# Patient Record
Sex: Female | Born: 1995 | Race: White | Hispanic: No | Marital: Single | Smoking: Never smoker
Health system: Southern US, Community
[De-identification: ages and names within clinical notes are randomized; demographics above are authoritative.]

## PROBLEM LIST (undated history)

## (undated) DIAGNOSIS — R768 Other specified abnormal immunological findings in serum: Secondary | ICD-10-CM

## (undated) DIAGNOSIS — R519 Headache, unspecified: Secondary | ICD-10-CM

## (undated) DIAGNOSIS — R5382 Chronic fatigue, unspecified: Secondary | ICD-10-CM

## (undated) DIAGNOSIS — R918 Other nonspecific abnormal finding of lung field: Secondary | ICD-10-CM

## (undated) DIAGNOSIS — F32A Depression, unspecified: Secondary | ICD-10-CM

## (undated) DIAGNOSIS — F329 Major depressive disorder, single episode, unspecified: Secondary | ICD-10-CM

## (undated) DIAGNOSIS — M2550 Pain in unspecified joint: Secondary | ICD-10-CM

## (undated) DIAGNOSIS — E538 Deficiency of other specified B group vitamins: Secondary | ICD-10-CM

## (undated) DIAGNOSIS — R51 Headache: Secondary | ICD-10-CM

## (undated) HISTORY — DX: Pain in unspecified joint: M25.50

## (undated) HISTORY — DX: Major depressive disorder, single episode, unspecified: F32.9

## (undated) HISTORY — DX: Other nonspecific abnormal finding of lung field: R91.8

## (undated) HISTORY — PX: OTHER SURGICAL HISTORY: SHX169

## (undated) HISTORY — DX: Deficiency of other specified B group vitamins: E53.8

## (undated) HISTORY — DX: Headache: R51

## (undated) HISTORY — DX: Other specified abnormal immunological findings in serum: R76.8

## (undated) HISTORY — DX: Headache, unspecified: R51.9

## (undated) HISTORY — DX: Chronic fatigue, unspecified: R53.82

## (undated) HISTORY — DX: Depression, unspecified: F32.A

---
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
## 2014-10-18 ENCOUNTER — Encounter: Payer: Self-pay | Admitting: Internal Medicine

## 2014-10-18 ENCOUNTER — Encounter (INDEPENDENT_AMBULATORY_CARE_PROVIDER_SITE_OTHER): Payer: Self-pay

## 2014-10-18 ENCOUNTER — Ambulatory Visit (INDEPENDENT_AMBULATORY_CARE_PROVIDER_SITE_OTHER): Payer: BC Managed Care – PPO | Admitting: Internal Medicine

## 2014-10-18 VITALS — BP 108/60 | HR 82 | Temp 98.1°F | Ht 66.75 in | Wt 120.0 lb

## 2014-10-18 DIAGNOSIS — F329 Major depressive disorder, single episode, unspecified: Secondary | ICD-10-CM

## 2014-10-18 DIAGNOSIS — F32A Depression, unspecified: Secondary | ICD-10-CM

## 2014-10-18 DIAGNOSIS — F419 Anxiety disorder, unspecified: Secondary | ICD-10-CM

## 2014-10-18 MED ORDER — ESCITALOPRAM OXALATE 10 MG PO TABS: 10.0000 mg | ORAL_TABLET | Freq: Every day | ORAL | Status: DC

## 2014-10-18 NOTE — Progress Notes (Signed)
Pre visit review using our clinic review tool, if applicable. No additional management support is needed unless otherwise documented below in the visit note. 

## 2014-10-18 NOTE — Patient Instructions (Signed)

## 2014-10-18 NOTE — Assessment & Plan Note (Signed)
Support offered today She does not feel like she wants to go up on her Lexapro She declines referral for counseling for her depression and self harm behaviors No SI/HI- instructed to stop her medication and go to the nearest ER if these thoughts ever occur

## 2014-10-18 NOTE — Progress Notes (Signed)
HPI  Pt presents to the clinic today to establish care. She is transferring care from her PCP in [place] although she is originally from [place].  Depression: Well controlled on Lexapro. She reports her parents forced her off of it while she was in high school. She did restart it it for the last 2 months ago. She reports it had been working well until 2 weeks ago when her friend told her that she was going to commit suicide. She was able to help her work through that but she has felt shaken up ever since. She does have a history of self harm. She has been cutting for the last 2 days. She feels like this is being triggered by her plans to travel home for the holidays.   Frequent Headaches: Associated with chronic vertigo since age 89. Her recent gluten/lactose free diet has helped.   Her LMP was 10/12/14.  Past Medical History  Diagnosis Date  . Depression   . Frequent headaches     Current Outpatient Prescriptions  Medication Sig Dispense Refill  . escitalopram (LEXAPRO) 10 MG tablet Take by mouth.    . hyoscyamine (ANASPAZ) 0.125 MG TBDP disintergrating tablet Place 0.125 mg under the tongue.    . Multiple Vitamin (MULTIVITAMIN) tablet Take 1 tablet by mouth daily.     No current facility-administered medications for this visit.    Allergies  Allergen Reactions  . Gluten Meal Other (See Comments)    "collapse"  . Lactose Intolerance (Gi) Other (See Comments)    Abdominal pain    Family History  Problem Relation Age of Onset  . Cancer Maternal Grandmother     lung and breast  . Diabetes Paternal Grandfather     History   Social History  . Marital Status: Single    Spouse Name: N/A    Number of Children: N/A  . Years of Education: N/A   Occupational History  . Not on file.   Social History Main Topics  . Smoking status: Never Smoker   . Smokeless tobacco: Never Used  . Alcohol Use: 0.0 oz/week    0 Not specified per week     Comment: occasional  . Drug Use: Not on  file  . Sexual Activity: Not on file   Other Topics Concern  . Not on file   Social History Narrative  . No narrative on file    ROS:  Constitutional: Denies fever, malaise, fatigue, headache or abrupt weight changes.  Respiratory: Denies difficulty breathing, shortness of breath, cough or sputum production.   Cardiovascular: Denies chest pain, chest tightness, palpitations or swelling in the hands or feet.  Gastrointestinal: Denies abdominal pain, bloating, constipation, diarrhea or blood in the stool.  GU: Denies frequency, urgency, pain with urination, blood in urine, odor or discharge. Psych: Pt reports depression. Denies anxiety, SI/HI.  No other specific complaints in a complete review of systems (except as listed in HPI above).  PE:  BP 108/60 mmHg  Pulse 82  Temp(Src) 98.1 F (36.7 C) (Oral)  Ht 5' 6.75" (1.695 m)  Wt 120 lb (54.432 kg)  BMI 18.95 kg/m2  SpO2 99%  LMP 10/12/2014 Wt Readings from Last 3 Encounters:  10/18/14 120 lb (54.432 kg) (34 %*, Z = -0.41)   * Growth percentiles are based on CDC 2-20 Years data.    General: Appears her stated age, well developed, well nourished in NAD. Cardiovascular: Normal rate and rhythm. S1,S2 noted.  No murmur, rubs or gallops noted.  Pulmonary/Chest: Normal effort and positive vesicular breath sounds. No respiratory distress. No wheezes, rales or ronchi noted.  Neurological: Alert and oriented.  Psychiatric: Mood and affect normal. Behavior is normal. Judgment and thought content normal.    Assessment and Plan:

## 2014-12-23 ENCOUNTER — Telehealth: Payer: Self-pay

## 2014-12-23 ENCOUNTER — Emergency Department: Payer: Self-pay | Admitting: Emergency Medicine

## 2014-12-23 LAB — COMPREHENSIVE METABOLIC PANEL
ALK PHOS: 89 U/L (ref 46–116)
ANION GAP: 6 — AB (ref 7–16)
Albumin: 3.7 g/dL — ABNORMAL LOW (ref 3.8–5.6)
BILIRUBIN TOTAL: 0.7 mg/dL (ref 0.2–1.0)
BUN: 8 mg/dL (ref 7–18)
CALCIUM: 9.7 mg/dL (ref 9.0–10.7)
CO2: 32 mmol/L (ref 21–32)
CREATININE: 0.83 mg/dL (ref 0.60–1.30)
Chloride: 104 mmol/L (ref 98–107)
EGFR (African American): >60
Glucose: 82 mg/dL (ref 65–99)
OSMOLALITY: 281 (ref 275–301)
POTASSIUM: 4.1 mmol/L (ref 3.5–5.1)
SGOT(AST): 50 U/L — ABNORMAL HIGH (ref 0–26)
SGPT (ALT): 51 U/L (ref 14–63)
SODIUM: 142 mmol/L (ref 136–145)
Total Protein: 8 g/dL (ref 6.4–8.6)

## 2014-12-23 LAB — CBC WITH DIFFERENTIAL/PLATELET
BASOS ABS: 0.1 10*3/uL (ref 0.0–0.1)
Basophil %: 0.6 %
EOS PCT: 1.3 %
Eosinophil #: 0.1 10*3/uL (ref 0.0–0.7)
HCT: 39.7 % (ref 35.0–47.0)
HGB: 13.5 g/dL (ref 12.0–16.0)
Lymphocyte #: 2.5 10*3/uL (ref 1.0–3.6)
Lymphocyte %: 28.3 %
MCH: 29.7 pg (ref 26.0–34.0)
MCHC: 33.9 g/dL (ref 32.0–36.0)
MCV: 88 fL (ref 80–100)
Monocyte #: 0.7 x10 3/mm (ref 0.2–0.9)
Monocyte %: 7.4 %
NEUTROS ABS: 5.5 10*3/uL (ref 1.4–6.5)
NEUTROS PCT: 62.4 %
Platelet: 293 10*3/uL (ref 150–440)
RBC: 4.54 10*6/uL (ref 3.80–5.20)
RDW: 13.2 % (ref 11.5–14.5)
WBC: 8.9 10*3/uL (ref 3.6–11.0)

## 2014-12-23 LAB — URINALYSIS, COMPLETE
Bilirubin,UR: NEGATIVE
Blood: NEGATIVE
Glucose,UR: NEGATIVE mg/dL (ref 0–75)
KETONE: NEGATIVE
LEUKOCYTE ESTERASE: NEGATIVE
Nitrite: NEGATIVE
PH: 9 (ref 4.5–8.0)
PROTEIN: NEGATIVE
SPECIFIC GRAVITY: 1.006 (ref 1.003–1.030)

## 2014-12-23 NOTE — Telephone Encounter (Signed)
Spoke with Nicki Reaperegina Baity NP and if blood more than tablespoon pt should be evaluated at ED; pt voiced understanding and will go to ED for eval.

## 2014-12-23 NOTE — Telephone Encounter (Signed)
pT was in [place] for 30 days; 2 weeks ago pt had pain in rt upper abd pain under rib cage. Pt saw a doctor in [place] and there was a virus with cramps; pt took med for 2 days and pain went away; then last day in [place] (12/12/14) pt ran high fever; Pt did not take temp due to no thermometer; pt took advil and within 24 - 30 hrs fever was gone. No fever or abd pain since in [place]. 5 days ago pt noticed harder stool (pt not constipated but BM is very hard). Pt said every time has BM in lat 5 days pt has a lot of bright red blood with BM (Pt said looks like she is having a menstrual period but she is not). Last BM was 12/22/14. Pt is feeling tired but thought might be related to jet lag. Pt returned home 12/15/14. Please advise.

## 2015-01-03 ENCOUNTER — Ambulatory Visit: Payer: Self-pay | Admitting: Family Medicine

## 2015-01-04 ENCOUNTER — Ambulatory Visit: Payer: Self-pay | Admitting: Family Medicine

## 2015-01-04 ENCOUNTER — Ambulatory Visit (INDEPENDENT_AMBULATORY_CARE_PROVIDER_SITE_OTHER): Payer: BLUE CROSS/BLUE SHIELD | Admitting: Family Medicine

## 2015-01-04 ENCOUNTER — Encounter: Payer: Self-pay | Admitting: Family Medicine

## 2015-01-04 VITALS — BP 98/60 | HR 74 | Temp 98.3°F | Ht 66.75 in | Wt 124.8 lb

## 2015-01-04 DIAGNOSIS — S30814A Abrasion of vagina and vulva, initial encounter: Secondary | ICD-10-CM

## 2015-01-04 DIAGNOSIS — B3731 Acute candidiasis of vulva and vagina: Secondary | ICD-10-CM | POA: Insufficient documentation

## 2015-01-04 DIAGNOSIS — B373 Candidiasis of vulva and vagina: Secondary | ICD-10-CM

## 2015-01-04 MED ORDER — FLUCONAZOLE 150 MG PO TABS: 150.0000 mg | ORAL_TABLET | Freq: Once | ORAL | Status: DC

## 2015-01-04 NOTE — Assessment & Plan Note (Signed)
Pt not sexually active.  Cover with barrier cream to avoid irritation and promote healing.

## 2015-01-04 NOTE — Assessment & Plan Note (Signed)
Treat with diflucan. 

## 2015-01-04 NOTE — Patient Instructions (Signed)
Apply barrier cream such as Desitin to abrasion for a few days. Treat likely yeast infection with diflucan.  Call if not improving as expected.

## 2015-01-04 NOTE — Progress Notes (Signed)
   Subjective:    Patient ID: Kristi Gray, female    DOB: 02/14/1996, 19 y.o.   MRN: [id number]  HPI   19 year old female presents with new onset  vaginal itching, burning redness, some area of bump.. Some sore spots in vaginal area. On menses, mild discharge prior to menses.  History of internal hemorrhoid.  Recent antibiotics started for face in last month.  She is not sexually active, never had sex.     Review of Systems  Constitutional: Negative for fever and fatigue.  HENT: Negative for ear pain.   Eyes: Negative for pain.  Respiratory: Negative for chest tightness and shortness of breath.   Cardiovascular: Negative for chest pain, palpitations and leg swelling.  Gastrointestinal: Negative for abdominal pain.  Genitourinary: Negative for dysuria.       Objective:   Physical Exam  Constitutional: Vital signs are normal. She appears well-developed and well-nourished. She is cooperative.  Non-toxic appearance. She does not appear ill. No distress.  HENT:  Head: Normocephalic.  Right Ear: Hearing, tympanic membrane, external ear and ear canal normal. Tympanic membrane is not erythematous, not retracted and not bulging.  Left Ear: Hearing, tympanic membrane, external ear and ear canal normal. Tympanic membrane is not erythematous, not retracted and not bulging.  Nose: No mucosal edema or rhinorrhea. Right sinus exhibits no maxillary sinus tenderness and no frontal sinus tenderness. Left sinus exhibits no maxillary sinus tenderness and no frontal sinus tenderness.  Mouth/Throat: Uvula is midline, oropharynx is clear and moist and mucous membranes are normal.  Eyes: Conjunctivae, EOM and lids are normal. Pupils are equal, round, and reactive to light. Lids are everted and swept, no foreign bodies found.  Neck: Trachea normal and normal range of motion. Neck supple. Carotid bruit is not present. No thyroid mass and no thyromegaly present.  Cardiovascular: Normal rate, regular  rhythm, S1 normal, S2 normal, normal heart sounds, intact distal pulses and normal pulses.  Exam reveals no gallop and no friction rub.   No murmur heard. Pulmonary/Chest: Effort normal and breath sounds normal. No tachypnea. No respiratory distress. She has no decreased breath sounds. She has no wheezes. She has no rhonchi. She has no rales.  Abdominal: Soft. Normal appearance and bowel sounds are normal. There is no tenderness.  Genitourinary: There is tenderness in the vagina. No erythema in the vagina. No foreign body around the vagina. There are signs of injury in the vagina. No vaginal discharge found.  Has menses but put tampon in place, small abrasion in perineum below vaginal opening, no other lesions   Neurological: She is alert.  Skin: Skin is warm, dry and intact. No rash noted.  Psychiatric: Her speech is normal and behavior is normal. Judgment and thought content normal. Her mood appears not anxious. Cognition and memory are normal. She does not exhibit a depressed mood.          Assessment & Plan:

## 2015-01-04 NOTE — Progress Notes (Signed)
Pre visit review using our clinic review tool, if applicable. No additional management support is needed unless otherwise documented below in the visit note. 

## 2015-02-27 ENCOUNTER — Other Ambulatory Visit: Payer: Self-pay

## 2015-02-27 ENCOUNTER — Telehealth: Payer: Self-pay | Admitting: Internal Medicine

## 2015-02-27 MED ORDER — CLONAZEPAM 0.5 MG PO TABS: 0.5000 mg | ORAL_TABLET | Freq: Every day | ORAL | Status: DC | PRN

## 2015-02-27 MED ORDER — ESCITALOPRAM OXALATE 5 MG PO TABS: 5.0000 mg | ORAL_TABLET | Freq: Every day | ORAL | Status: DC

## 2015-02-27 NOTE — Telephone Encounter (Signed)
Rx called in to pharmacy. 

## 2015-02-27 NOTE — Telephone Encounter (Signed)
Received a fax from her therapist, Montel ClockKatherine Wagner. She has been working with [employer] on anxiety and panic attacks. She continues to have worsening anxiety and panic attacks. She recommends increasing Lexapro to 15 mg and adding Klonipin prn. Lexapro increased. Please phone in Klonipin.

## 2015-02-28 ENCOUNTER — Ambulatory Visit (INDEPENDENT_AMBULATORY_CARE_PROVIDER_SITE_OTHER): Payer: BLUE CROSS/BLUE SHIELD | Admitting: Internal Medicine

## 2015-02-28 ENCOUNTER — Encounter: Payer: Self-pay | Admitting: Internal Medicine

## 2015-02-28 VITALS — BP 102/66 | HR 73 | Temp 98.5°F | Wt 121.5 lb

## 2015-02-28 DIAGNOSIS — F32A Depression, unspecified: Secondary | ICD-10-CM

## 2015-02-28 DIAGNOSIS — L709 Acne, unspecified: Secondary | ICD-10-CM | POA: Diagnosis not present

## 2015-02-28 DIAGNOSIS — F419 Anxiety disorder, unspecified: Principal | ICD-10-CM

## 2015-02-28 DIAGNOSIS — F329 Major depressive disorder, single episode, unspecified: Secondary | ICD-10-CM

## 2015-02-28 DIAGNOSIS — F418 Other specified anxiety disorders: Secondary | ICD-10-CM

## 2015-02-28 MED ORDER — MINOCYCLINE HCL 100 MG PO CAPS: 100.0000 mg | ORAL_CAPSULE | Freq: Every day | ORAL | Status: DC

## 2015-02-28 NOTE — Patient Instructions (Signed)
Generalized Anxiety Disorder Generalized anxiety disorder (GAD) is a mental disorder. It interferes with life functions, including relationships, work, and school. GAD is different from normal anxiety, which everyone experiences at some point in their lives in response to specific life events and activities. Normal anxiety actually helps us prepare for and get through these life events and activities. Normal anxiety goes away after the event or activity is over.  GAD causes anxiety that is not necessarily related to specific events or activities. It also causes excess anxiety in proportion to specific events or activities. The anxiety associated with GAD is also difficult to control. GAD can vary from mild to severe. People with severe GAD can have intense waves of anxiety with physical symptoms (panic attacks).  SYMPTOMS The anxiety and worry associated with GAD are difficult to control. This anxiety and worry are related to many life events and activities and also occur more days than not for 6 months or longer. People with GAD also have three or more of the following symptoms (one or more in children):  Restlessness.   Fatigue.  Difficulty concentrating.   Irritability.  Muscle tension.  Difficulty sleeping or unsatisfying sleep. DIAGNOSIS GAD is diagnosed through an assessment by your health care provider. Your health care provider will ask you questions aboutyour mood,physical symptoms, and events in your life. Your health care provider may ask you about your medical history and use of alcohol or drugs, including prescription medicines. Your health care provider may also do a physical exam and blood tests. Certain medical conditions and the use of certain substances can cause symptoms similar to those associated with GAD. Your health care provider may refer you to a mental health specialist for further evaluation. TREATMENT The following therapies are usually used to treat GAD:    Medication. Antidepressant medication usually is prescribed for long-term daily control. Antianxiety medicines may be added in severe cases, especially when panic attacks occur.   Talk therapy (psychotherapy). Certain types of talk therapy can be helpful in treating GAD by providing support, education, and guidance. A form of talk therapy called cognitive behavioral therapy can teach you healthy ways to think about and react to daily life events and activities.  Stress managementtechniques. These include yoga, meditation, and exercise and can be very helpful when they are practiced regularly. A mental health specialist can help determine which treatment is best for you. Some people see improvement with one therapy. However, other people require a combination of therapies. Document Released: 03/01/2013 Document Revised: 03/21/2014 Document Reviewed: 03/01/2013 ExitCare Patient Information 2015 [employer]. This information is not intended to replace advice given to you by your health care provider. Make sure you discuss any questions you have with your health care provider.  

## 2015-02-28 NOTE — Progress Notes (Signed)
Pre visit review using our clinic review tool, if applicable. No additional management support is needed unless otherwise documented below in the visit note. 

## 2015-02-28 NOTE — Progress Notes (Signed)
Subjective:    Patient ID: Kristi Gray, female    DOB: 08/20/1996, 19 y.o.   MRN: [id number]  HPI  Pt presents to the clinic today to follow up anxiety and depression. I received a fax from her therapist earlier this week. Her anxiety had gotten significantly worse. Her therapist reports that she had about 8 panic attacks in 1 week. Her therapist advised her to take 15 mg of Lexapro daily. She only had 10 mg capsules, so she has been taking 10 mg one day followed by 20 mg the next day. I also called her in some Clonazepam to help control her anxiety until she could get in to see me. She reports her symptoms have not improved. She still feels very anxious and she is having pressure in her chest. She is not sure what is triggering her constant anxiety. She denies SI/HI. She reports she will be getting an emotional support animal in the fall and thinks that will be helpful.  Of note, she would like her Minocycline refilled. It works well for her acne.  Review of Systems      Past Medical History  Diagnosis Date  . Depression   . Frequent headaches     Current Outpatient Prescriptions  Medication Sig Dispense Refill  . clonazePAM (KLONOPIN) 0.5 MG tablet Take 1 tablet (0.5 mg total) by mouth daily as needed for anxiety. 30 tablet 0  . escitalopram (LEXAPRO) 10 MG tablet Take 1 tablet (10 mg total) by mouth daily. 90 tablet 3  . escitalopram (LEXAPRO) 5 MG tablet Take 1 tablet (5 mg total) by mouth daily. 30 tablet 2  . hyoscyamine (ANASPAZ) 0.125 MG TBDP disintergrating tablet Place 0.125 mg under the tongue.    . Miconazole Nitrate (MONISTAT 3 VA) Place vaginally.    . minocycline (MINOCIN,DYNACIN) 100 MG capsule Take 100 mg by mouth daily.    . Multiple Vitamin (MULTIVITAMIN) tablet Take 1 tablet by mouth daily.     No current facility-administered medications for this visit.    Allergies  Allergen Reactions  . Gluten Meal Other (See Comments)    "collapse"  . Lactose  Intolerance (Gi) Other (See Comments)    Abdominal pain    Family History  Problem Relation Age of Onset  . Cancer Maternal Grandmother     lung and breast  . Diabetes Paternal Grandfather   . Stroke Neg Hx     History   Social History  . Marital Status: Single    Spouse Name: N/A  . Number of Children: N/A  . Years of Education: N/A   Occupational History  . Not on file.   Social History Main Topics  . Smoking status: Never Smoker   . Smokeless tobacco: Never Used  . Alcohol Use: 0.0 oz/week    0 Standard drinks or equivalent per week     Comment: occasional  . Drug Use: No  . Sexual Activity: No   Other Topics Concern  . Not on file   Social History Narrative     Constitutional: Denies fever, malaise, fatigue, headache or abrupt weight changes.  Respiratory: Pt reports shortness of breath. Denies difficulty breathing, cough or sputum production.   Cardiovascular: Pt reports chest tightness. Denies chest pain, palpitations or swelling in the hands or feet.  Neurological: Denies dizziness, difficulty with memory, difficulty with speech or problems with balance and coordination.  Psych: Pt reports anxiety and panic attacks. Denies depression, SI/HI.  No other specific complaints in  a complete review of systems (except as listed in HPI above).  Objective:   Physical Exam  BP 102/66 mmHg  Pulse 73  Temp(Src) 98.5 F (36.9 C) (Oral)  Wt 121 lb 8 oz (55.112 kg)  SpO2 99%  LMP 02/08/2015 Wt Readings from Last 3 Encounters:  02/28/15 121 lb 8 oz (55.112 kg)  01/04/15 124 lb 12 oz (56.586 kg) (43 %*, Z = -0.17)  10/18/14 120 lb (54.432 kg) (34 %*, Z = -0.41)   * Growth percentiles are based on CDC 2-20 Years data.    General: Appears herstated age, well developed, well nourished in NAD. Skin: Warm, dry and intact.  Cardiovascular: Normal rate and rhythm. S1,S2 noted.  No murmur, rubs or gallops noted.  Pulmonary/Chest: Normal effort and positive vesicular  breath sounds. No respiratory distress. No wheezes, rales or ronchi noted.  Neurological: Alert and oriented.  Psychiatric: Mood slightly anxious but affect normal. Behavior is normal. Judgment and thought content normal.       Assessment & Plan:

## 2015-02-28 NOTE — Assessment & Plan Note (Signed)
Continue working with your therapist Will increase Lexapro to 15 mg daily Clonazepam prn for panic attack  Update me in 4 weeks on how you are doing

## 2015-02-28 NOTE — Assessment & Plan Note (Signed)
Minocycline refilled today 

## 2015-03-28 ENCOUNTER — Other Ambulatory Visit: Payer: Self-pay | Admitting: Internal Medicine

## 2015-06-06 ENCOUNTER — Other Ambulatory Visit: Payer: Self-pay

## 2015-06-06 DIAGNOSIS — F329 Major depressive disorder, single episode, unspecified: Secondary | ICD-10-CM

## 2015-06-06 DIAGNOSIS — F32A Depression, unspecified: Secondary | ICD-10-CM

## 2015-06-06 MED ORDER — ESCITALOPRAM OXALATE 10 MG PO TABS: 10.0000 mg | ORAL_TABLET | Freq: Every day | ORAL | Status: DC

## 2015-06-06 MED ORDER — ESCITALOPRAM OXALATE 5 MG PO TABS: 5.0000 mg | ORAL_TABLET | Freq: Every day | ORAL | Status: DC

## 2015-06-06 NOTE — Telephone Encounter (Signed)
Pt left v/m; pt staying in [place] for the summer; pt just spoke with therapist and therapist is to request Lexapro refill (pt takes 5 mg and 10 mg)from [facility] Incegina Baity NP(pt said she takes 15 mg of Lexapro; should the lexapro 5 mg instructions be changed to 1 tab daily instead of 3 tabs daily since pt is requesting the 10 mg and 5 mg tabs?). Pt request refill to go to [facility] [address], [place]Please advise.

## 2015-07-13 ENCOUNTER — Other Ambulatory Visit: Payer: Self-pay | Admitting: Internal Medicine

## 2015-08-07 ENCOUNTER — Other Ambulatory Visit: Payer: Self-pay | Admitting: Internal Medicine

## 2015-08-08 MED ORDER — ESCITALOPRAM OXALATE 5 MG PO TABS: 5.0000 mg | ORAL_TABLET | Freq: Every day | ORAL | Status: DC

## 2016-01-08 ENCOUNTER — Telehealth: Payer: Self-pay

## 2016-01-08 NOTE — Telephone Encounter (Signed)
Pt left v/m; pt was recently discharged from residential treatment center in [place] then went to [facility], fax # for [facility] fax # [phone]; now needs refill lexapro 20 mg. Med list has lexapro 15 mg. Pt is Consulting civil engineer at [employer]. Pt request cb. Unable to reach pt by phone. Pt last seen at [facility] 02/28/15. No future appt scheduled.

## 2016-01-08 NOTE — Telephone Encounter (Signed)
I will need discharge notes from [place] before I can refill

## 2016-01-09 ENCOUNTER — Telehealth: Payer: Self-pay | Admitting: Internal Medicine

## 2016-01-09 NOTE — Telephone Encounter (Signed)
Patient stated that her last provider said they sent the information to [facility] on Nov 29, 2015.  She said the records should show her most recent dosage information so she can have her prescription renewed.

## 2016-01-09 NOTE — Telephone Encounter (Signed)
Left detailed msg on VM per HIPAA  

## 2016-01-09 NOTE — Telephone Encounter (Signed)
Pt states she will call to see if they can fax information to [place] otherwise she will stop by the office here to sign a medical records release so can fax

## 2016-01-10 ENCOUNTER — Telehealth: Payer: Self-pay | Admitting: Internal Medicine

## 2016-01-10 MED ORDER — ESCITALOPRAM OXALATE 20 MG PO TABS: 20.0000 mg | ORAL_TABLET | Freq: Every day | ORAL | Status: DC

## 2016-01-10 NOTE — Telephone Encounter (Signed)
[facility] from [facility]. Increased Lexapro to 20 mg daily. New RX sent to pharmacy. Please call and let pt know RX was sent.

## 2016-01-10 NOTE — Telephone Encounter (Signed)
Spoke to pt and let her know we had previous records but not from [place]---explained to pt for me to request records, she will have to come in to sign a medical records release---pt stated that she will call the office to see if they will forward records to [place]

## 2016-01-10 NOTE — Telephone Encounter (Signed)
Left detailed msg on VM per HIPAA  

## 2016-01-24 NOTE — Telephone Encounter (Signed)
ERROR

## 2016-08-02 ENCOUNTER — Ambulatory Visit (INDEPENDENT_AMBULATORY_CARE_PROVIDER_SITE_OTHER): Payer: BLUE CROSS/BLUE SHIELD | Admitting: Family Medicine

## 2016-08-02 ENCOUNTER — Encounter: Payer: Self-pay | Admitting: Family Medicine

## 2016-08-02 ENCOUNTER — Telehealth: Payer: Self-pay | Admitting: Internal Medicine

## 2016-08-02 VITALS — BP 80/62 | HR 79 | Temp 98.1°F | Ht 66.75 in | Wt 118.8 lb

## 2016-08-02 DIAGNOSIS — F329 Major depressive disorder, single episode, unspecified: Secondary | ICD-10-CM

## 2016-08-02 DIAGNOSIS — F419 Anxiety disorder, unspecified: Secondary | ICD-10-CM

## 2016-08-02 DIAGNOSIS — Z8719 Personal history of other diseases of the digestive system: Secondary | ICD-10-CM

## 2016-08-02 DIAGNOSIS — F418 Other specified anxiety disorders: Secondary | ICD-10-CM | POA: Diagnosis not present

## 2016-08-02 DIAGNOSIS — R1084 Generalized abdominal pain: Secondary | ICD-10-CM | POA: Diagnosis not present

## 2016-08-02 DIAGNOSIS — F32A Depression, unspecified: Secondary | ICD-10-CM

## 2016-08-02 LAB — CBC WITH DIFFERENTIAL/PLATELET
BASOS ABS: 62 {cells}/uL (ref 0–200)
Basophils Relative: 1 %
EOS ABS: 124 {cells}/uL (ref 15–500)
EOS PCT: 2 %
HCT: 41.7 % (ref 35.0–45.0)
Hemoglobin: 13.9 g/dL (ref 11.7–15.5)
LYMPHS PCT: 37 %
Lymphs Abs: 2294 cells/uL (ref 850–3900)
MCH: 29.4 pg (ref 27.0–33.0)
MCHC: 33.3 g/dL (ref 32.0–36.0)
MCV: 88.3 fL (ref 80.0–100.0)
MONOS PCT: 7 %
MPV: 9.5 fL (ref 7.5–12.5)
Monocytes Absolute: 434 cells/uL (ref 200–950)
NEUTROS ABS: 3286 {cells}/uL (ref 1500–7800)
NEUTROS PCT: 53 %
PLATELETS: 279 10*3/uL (ref 140–400)
RBC: 4.72 MIL/uL (ref 3.80–5.10)
RDW: 12.8 % (ref 11.0–15.0)
WBC: 6.2 10*3/uL (ref 3.8–10.8)

## 2016-08-02 LAB — POCT URINALYSIS DIPSTICK
BILIRUBIN UA: NEGATIVE
Glucose, UA: NEGATIVE
KETONES UA: NEGATIVE
LEUKOCYTES UA: NEGATIVE
Nitrite, UA: NEGATIVE
PH UA: 5
PROTEIN UA: NEGATIVE
Spec Grav, UA: 1.02
Urobilinogen, UA: 0.2

## 2016-08-02 LAB — COMPREHENSIVE METABOLIC PANEL
ALT: 14 U/L (ref 6–29)
AST: 27 U/L (ref 10–30)
Albumin: 4.4 g/dL (ref 3.6–5.1)
Alkaline Phosphatase: 47 U/L (ref 33–115)
BUN: 9 mg/dL (ref 7–25)
CHLORIDE: 104 mmol/L (ref 98–110)
CO2: 25 mmol/L (ref 20–31)
CREATININE: 0.81 mg/dL (ref 0.50–1.10)
Calcium: 9.6 mg/dL (ref 8.6–10.2)
Glucose, Bld: 76 mg/dL (ref 65–99)
POTASSIUM: 4.1 mmol/L (ref 3.5–5.3)
SODIUM: 137 mmol/L (ref 135–146)
Total Bilirubin: 1.1 mg/dL (ref 0.2–1.2)
Total Protein: 7.3 g/dL (ref 6.1–8.1)

## 2016-08-02 LAB — LIPASE: LIPASE: 16 U/L (ref 7–60)

## 2016-08-02 NOTE — Patient Instructions (Signed)
BEFORE YOU LEAVE: -follow up: 1) schedule follow up with PCP in about 5-7 days -labs  No dairy for next 1- 2 weeks.  levsin per instructions  nexium over the counter dose once daily  Seek care sooner if worsening, new symptoms or other concerns.

## 2016-08-02 NOTE — Addendum Note (Signed)
Addended by: Baldwin CrownJOHNSON, SHAQUETTA D on: 08/02/2016 04:08 PM   Modules accepted: Orders

## 2016-08-02 NOTE — Progress Notes (Signed)
HPI:  Kristi Gray is a pleasant 20 yo with a hx of IBS, migraines, gluten intolerance, depression and anxiety here for an acute visit for abd pain. Started 5 days ago. Symptoms include intermittent, non-focal difuse crampy abd pain, odor to urine, and migraine a few days ago accompanied by vertigo (she reports she has had this all in the past). Denies: fevers, malaise, weakness, diarrhea, constipation, nausea, vomiting, melena, hematochezia, dysuria, hematuria, vaginal discharge. Reports has never been sexually active and declines pelvic or STI testing. FDLMP 9/10 - just finished her period. Took levsin once. Symptoms worse before bowel movement and better with BM. Hx appendectomy. No new stressors or worsening anxiety/depression.   ROS: See pertinent positives and negatives per HPI.  Past Medical History:  Diagnosis Date  . Depression   . Frequent headaches     Past Surgical History:  Procedure Laterality Date  . APPENDECTOMY  2008  . wisdom teeth      Family History  Problem Relation Age of Onset  . Cancer Maternal Grandmother     lung and breast  . Diabetes Paternal Grandfather   . Stroke Neg Hx     Social History   Social History  . Marital status: Single    Spouse name: N/A  . Number of children: N/A  . Years of education: N/A   Social History Main Topics  . Smoking status: Never Smoker  . Smokeless tobacco: Never Used  . Alcohol use 0.0 oz/week     Comment: occasional  . Drug use: No  . Sexual activity: No   Other Topics Concern  . None   Social History Narrative  . None     Current Outpatient Prescriptions:  .  escitalopram (LEXAPRO) 20 MG tablet, Take 1 tablet (20 mg total) by mouth daily., Disp: 30 tablet, Rfl: 5 .  Hyoscyamine Sulfate (LEVSIN PO), Take by mouth., Disp: , Rfl:  .  levonorgestrel-ethinyl estradiol (LUTERA) 0.1-20 MG-MCG tablet, Take 1 tablet by mouth daily., Disp: , Rfl:   EXAM:  Vitals:   08/02/16 1505  BP: (!) 80/62   Pulse: 79  Temp: 98.1 F (36.7 C)    Body mass index is 18.75 kg/m.  GENERAL: vitals reviewed and listed above, alert, oriented, appears well hydrated and in no acute distress  HEENT: atraumatic, conjunttiva clear, no obvious abnormalities on inspection of external nose and ears  NECK: no obvious masses on inspection  LUNGS: clear to auscultation bilaterally, no wheezes, rales or rhonchi, good air movement  CV: HRRR, no peripheral edema  ABD: soft, BS+ all 4 quadrant, difuse TTP but seems worse in epigastric region, no rebound or guarding  GU: declined  MS: moves all extremities without noticeable abnormality  PSYCH: pleasant and cooperative, no obvious depression or anxiety  ASSESSMENT AND PLAN:  Discussed the following assessment and plan:  Generalized abdominal pain - Plan: CBC (no diff), Lipase, CMP, H. pylori antigen, stool  History of IBS  Anxiety and depression  -we discussed possible serious and likely etiologies, workup and treatment, treatment risks and return precautions -after this discussion, Kristi Gray opted for labs today, urine studies, levsin, avoidance of dairy, ppi and close follow up -of course, we advised Kristi Gray  to return or notify a doctor immediately if symptoms worsen or persist or new concerns arise.   Patient Instructions  BEFORE YOU LEAVE: -follow up: 1) schedule follow up with PCP in about 5-7 days -labs  No dairy for next 1- 2 weeks.  levsin per instructions  nexium over the counter dose once daily  Seek care sooner if worsening, new symptoms or other concerns.    Kriste Basque R., DO

## 2016-08-02 NOTE — Telephone Encounter (Signed)
Patient Name: Kristi Gray  DOB: 03/30/1996    Initial Comment Caller says, severe abd pains, constant, trouble walking. Previous Dx was IBS.    Nurse Assessment  Nurse: Deatra JamesNoe, RN, Corrie DandyMary Date/Time Lamount Cohen(Eastern Time): 08/02/2016 12:45:13 PM  Confirm and document reason for call. If symptomatic, describe symptoms. You must click the next button to save text entered. ---Patient states she is having severe abd pains, constant, trouble walking. Previous Dx was IBS.  Has the patient traveled out of the country within the last 30 days? ---No  Does the patient have any new or worsening symptoms? ---Yes  Will a triage be completed? ---Yes  Related visit to physician within the last 2 weeks? ---No  Does the PT have any chronic conditions? (i.e. diabetes, asthma, etc.) ---Yes  List chronic conditions. ---"severe gluten intolerance, migraines, vertigo"  Is the patient pregnant or possibly pregnant? (Ask all females between the ages of 3412-55) ---No  Is this a behavioral health or substance abuse call? ---No     Guidelines    Guideline Title Affirmed Question Affirmed Notes       Final Disposition User        Comments  [facility] to [facility] made appointment with Dr. Selena BattenKim in [place] office   Referrals  REFERRED TO PCP OFFICE

## 2016-08-02 NOTE — Addendum Note (Signed)
Addended by: Baldwin CrownJOHNSON, SHAQUETTA D on: 08/02/2016 04:12 PM   Modules accepted: Orders

## 2016-08-02 NOTE — Progress Notes (Signed)
Pre visit review using our clinic review tool, if applicable. No additional management support is needed unless otherwise documented below in the visit note. 

## 2016-08-02 NOTE — Telephone Encounter (Signed)
pt

## 2016-08-02 NOTE — Addendum Note (Signed)
Addended by: Johnella MoloneyFUNDERBURK, JO A on: 08/02/2016 04:01 PM   Modules accepted: Orders

## 2016-08-02 NOTE — Telephone Encounter (Signed)
Pt has appt with Dr Kriste BasqueKim Hannah 09/*15/17 at 3:30.

## 2016-08-03 LAB — URINALYSIS, ROUTINE W REFLEX MICROSCOPIC
Bilirubin Urine: NEGATIVE
GLUCOSE, UA: NEGATIVE
Hgb urine dipstick: NEGATIVE
Ketones, ur: NEGATIVE
LEUKOCYTES UA: NEGATIVE
Nitrite: NEGATIVE
PH: 7 (ref 5.0–8.0)
PROTEIN: NEGATIVE
SPECIFIC GRAVITY, URINE: 1.012 (ref 1.001–1.035)

## 2016-08-05 ENCOUNTER — Encounter: Payer: Self-pay | Admitting: Internal Medicine

## 2016-08-05 ENCOUNTER — Ambulatory Visit (INDEPENDENT_AMBULATORY_CARE_PROVIDER_SITE_OTHER): Payer: BLUE CROSS/BLUE SHIELD | Admitting: Internal Medicine

## 2016-08-05 VITALS — BP 94/58 | HR 80 | Temp 98.8°F | Wt 119.2 lb

## 2016-08-05 DIAGNOSIS — R1084 Generalized abdominal pain: Secondary | ICD-10-CM

## 2016-08-05 DIAGNOSIS — R195 Other fecal abnormalities: Secondary | ICD-10-CM | POA: Diagnosis not present

## 2016-08-05 DIAGNOSIS — F32A Depression, unspecified: Secondary | ICD-10-CM

## 2016-08-05 DIAGNOSIS — F418 Other specified anxiety disorders: Secondary | ICD-10-CM | POA: Diagnosis not present

## 2016-08-05 DIAGNOSIS — F419 Anxiety disorder, unspecified: Secondary | ICD-10-CM

## 2016-08-05 DIAGNOSIS — K589 Irritable bowel syndrome without diarrhea: Secondary | ICD-10-CM

## 2016-08-05 DIAGNOSIS — F329 Major depressive disorder, single episode, unspecified: Secondary | ICD-10-CM

## 2016-08-05 DIAGNOSIS — R110 Nausea: Secondary | ICD-10-CM | POA: Diagnosis not present

## 2016-08-05 MED ORDER — ESCITALOPRAM OXALATE 20 MG PO TABS: 20.0000 mg | ORAL_TABLET | Freq: Every day | ORAL | 5 refills | Status: DC

## 2016-08-05 NOTE — Assessment & Plan Note (Signed)
Liver function reviewed Lesapro refilled today

## 2016-08-05 NOTE — Progress Notes (Signed)
Subjective:    Patient ID: Kristi Gray, female    DOB: 01/28/1996, 20 y.o.   MRN: [id number]  HPI  Pt presents to the clinic today to followup abdominal pain. She was seen 08/02/16 for the same by Dr. Demaris CallanderKim-note reviewed. Dr. Selena BattenKim felt like this may be related to her IBS. Urinalysis, CBC, CMET, and Lipase were all normal. She dropped off a stool sample this morning for an H Pylori test. She reports she was told to avoid gluten (history of Celiac's disease) and lactose. She reports the pain in on her left side. It was intermittent at first, but she reports it is more constant now. She describes the pain as sharp and stabbing. Laying down and standing up makes the pain worse. Nothing seems to make it better. She does have associated nausea and some loose stools but denies vomiting, constipation or blood in her stool. She has tried Bentyl given to her by [place] in [place], but reports it has not helped. She denies recent changes in diet, medications or increase in stress/anxiety. She has had an appendectomy in the past. She denies urinary or vaginal complaints.  She is also requesting a refill of her Lexapro. She is taking it as prescribed with good results. She denies adverse side effects. She denies SI/HI.  Review of Systems  Past Medical History:  Diagnosis Date  . Depression   . Frequent headaches     Current Outpatient Prescriptions  Medication Sig Dispense Refill  . escitalopram (LEXAPRO) 20 MG tablet Take 1 tablet (20 mg total) by mouth daily. 30 tablet 5  . Hyoscyamine Sulfate (LEVSIN PO) Take by mouth.    . levonorgestrel-ethinyl estradiol (LUTERA) 0.1-20 MG-MCG tablet Take 1 tablet by mouth daily.     No current facility-administered medications for this visit.     Allergies  Allergen Reactions  . Gluten Meal Other (See Comments)    "collapse"  . Lactose Intolerance (Gi) Other (See Comments)    Abdominal pain    Family History  Problem Relation Age of Onset  .  Cancer Maternal Grandmother     lung and breast  . Diabetes Paternal Grandfather   . Stroke Neg Hx     Social History   Social History  . Marital status: Single    Spouse name: N/A  . Number of children: N/A  . Years of education: N/A   Occupational History  . Not on file.   Social History Main Topics  . Smoking status: Never Smoker  . Smokeless tobacco: Never Used  . Alcohol use 0.0 oz/week     Comment: occasional  . Drug use: No  . Sexual activity: No   Other Topics Concern  . Not on file   Social History Narrative  . No narrative on file     Constitutional: Denies fever, malaise, fatigue, headache or abrupt weight changes.  Respiratory: Denies difficulty breathing, shortness of breath, cough or sputum production.   Cardiovascular: Denies chest pain, chest tightness, palpitations or swelling in the hands or feet.  Gastrointestinal: Pt reports nausea and loose stool. Denies abdominal pain, bloating, constipation, or blood in the stool.  GU: Denies urgency, frequency, pain with urination, burning sensation, blood in urine, odor or discharge. Psych: Pt has history of depression. Denies SI/HI.  No other specific complaints in a complete review of systems (except as listed in HPI above).     Objective:   Physical Exam  BP (!) 94/58   Pulse 80  Temp 98.8 F (37.1 C) (Oral)   Wt 119 lb 4 oz (54.1 kg)   LMP 07/28/2016 (Exact Date)   SpO2 99%   BMI 18.82 kg/m  Wt Readings from Last 3 Encounters:  08/05/16 119 lb 4 oz (54.1 kg)  08/02/16 118 lb 12.8 oz (53.9 kg)  02/28/15 121 lb 8 oz (55.1 kg)    General: Appears her stated age, well developed, well nourished in NAD.  Cardiovascular: Normal rate and rhythm. S1,S2 noted.  No murmur, rubs or gallops noted.  Pulmonary/Chest: Normal effort and positive vesicular breath sounds. No respiratory distress. No wheezes, rales or ronchi noted.  Abdomen: Soft and tender in the LUQ, RUQ and RLQ. Normal bowel sounds. No  distention or masses noted. . Neurological: Alert and oriented.  Psychiatric: She is mildly anxious appearing today.  BMET    Component Value Date/Time   NA 137 08/02/2016 1608   NA 142 12/23/2014 1601   K 4.1 08/02/2016 1608   K 4.1 12/23/2014 1601   CL 104 08/02/2016 1608   CL 104 12/23/2014 1601   CO2 25 08/02/2016 1608   CO2 32 12/23/2014 1601   GLUCOSE 76 08/02/2016 1608   GLUCOSE 82 12/23/2014 1601   BUN 9 08/02/2016 1608   BUN 8 12/23/2014 1601   CREATININE 0.81 08/02/2016 1608   CALCIUM 9.6 08/02/2016 1608   CALCIUM 9.7 12/23/2014 1601   GFRNONAA >60 12/23/2014 1601   GFRAA >60 12/23/2014 1601    Lipid Panel  No results found for: CHOL, TRIG, HDL, CHOLHDL, VLDL, LDLCALC  CBC    Component Value Date/Time   WBC 6.2 08/02/2016 1608   RBC 4.72 08/02/2016 1608   HGB 13.9 08/02/2016 1608   HGB 13.5 12/23/2014 1601   HCT 41.7 08/02/2016 1608   HCT 39.7 12/23/2014 1601   PLT 279 08/02/2016 1608   PLT 293 12/23/2014 1601   MCV 88.3 08/02/2016 1608   MCV 88 12/23/2014 1601   MCH 29.4 08/02/2016 1608   MCHC 33.3 08/02/2016 1608   RDW 12.8 08/02/2016 1608   RDW 13.2 12/23/2014 1601   LYMPHSABS 2,294 08/02/2016 1608   LYMPHSABS 2.5 12/23/2014 1601   MONOABS 434 08/02/2016 1608   MONOABS 0.7 12/23/2014 1601   EOSABS 124 08/02/2016 1608   EOSABS 0.1 12/23/2014 1601   BASOSABS 62 08/02/2016 1608   BASOSABS 0.1 12/23/2014 1601    Hgb A1C No results found for: HGBA1C     Assessment & Plan:   Generalized abdominal pain, nausea and loose stool:  ? If this is a flare of her IBS, but she is insistent this feel different Labs reviewed- waiting for results of H Pylori Start Prilosec OTC 20 mg before breakfast until results come back Continue to avoid gluten and dairy She is requesting CT scan of abdomen due to length and severity of pain, CT scan ordered- see Shirlee Limerick on the way out to schedule  To ER if worse, otherwise will follow up after CT scan  Nicki Reaper, NP

## 2016-08-05 NOTE — Patient Instructions (Signed)

## 2016-08-06 LAB — URINE CULTURE

## 2016-08-09 ENCOUNTER — Ambulatory Visit
Admission: RE | Admit: 2016-08-09 | Discharge: 2016-08-09 | Disposition: A | Discharge status: Home / Self Care | Payer: BLUE CROSS/BLUE SHIELD | Source: Ambulatory Visit | Attending: Internal Medicine | Admitting: Internal Medicine

## 2016-08-09 DIAGNOSIS — R918 Other nonspecific abnormal finding of lung field: Secondary | ICD-10-CM | POA: Insufficient documentation

## 2016-08-09 DIAGNOSIS — R1084 Generalized abdominal pain: Secondary | ICD-10-CM

## 2016-08-09 MED ORDER — IOPAMIDOL (ISOVUE-300) INJECTION 61%
75.0000 mL | Freq: Once | INTRAVENOUS | Status: AC | PRN
Start: 2016-08-09 — End: 2016-08-09
  Administered 2016-08-09: 75 mL via INTRAVENOUS

## 2016-08-12 ENCOUNTER — Telehealth: Payer: Self-pay | Admitting: Internal Medicine

## 2016-08-12 NOTE — Telephone Encounter (Signed)
Patient returned call about her CT results.

## 2016-08-16 NOTE — Telephone Encounter (Signed)
Pt returned your call. Please call back.

## 2016-08-19 ENCOUNTER — Other Ambulatory Visit (INDEPENDENT_AMBULATORY_CARE_PROVIDER_SITE_OTHER): Payer: BLUE CROSS/BLUE SHIELD

## 2016-08-19 ENCOUNTER — Other Ambulatory Visit: Payer: Self-pay | Admitting: Internal Medicine

## 2016-08-19 DIAGNOSIS — J8410 Pulmonary fibrosis, unspecified: Secondary | ICD-10-CM

## 2016-08-19 LAB — C-REACTIVE PROTEIN: CRP: 0.1 mg/dL — ABNORMAL LOW (ref 0.5–20.0)

## 2016-08-19 LAB — SEDIMENTATION RATE: Sed Rate: 12 mm/hr (ref 0–20)

## 2016-08-19 NOTE — Telephone Encounter (Signed)
Patient called.  I spoke to [place] and she said to make lab appointment for patient.  I scheduled patient for lab work today at 1:30.  Rene KocherRegina will put the lab orders in Epic.

## 2016-08-20 LAB — RHEUMATOID FACTOR

## 2016-08-20 LAB — ANA: ANA: POSITIVE — AB

## 2016-08-20 LAB — ANTI-NUCLEAR AB-TITER (ANA TITER): ANA Titer 1: 1:80 {titer} — ABNORMAL HIGH

## 2016-08-20 LAB — HIV ANTIBODY (ROUTINE TESTING W REFLEX): HIV: NONREACTIVE

## 2016-08-22 ENCOUNTER — Telehealth: Payer: Self-pay | Admitting: Internal Medicine

## 2016-08-22 NOTE — Telephone Encounter (Signed)
Patient called to get her lab results. 

## 2016-08-23 ENCOUNTER — Encounter: Payer: Self-pay | Admitting: Internal Medicine

## 2016-08-23 ENCOUNTER — Other Ambulatory Visit: Payer: Self-pay | Admitting: Internal Medicine

## 2016-08-23 DIAGNOSIS — R768 Other specified abnormal immunological findings in serum: Secondary | ICD-10-CM

## 2016-08-23 DIAGNOSIS — J8410 Pulmonary fibrosis, unspecified: Secondary | ICD-10-CM

## 2016-08-23 NOTE — Telephone Encounter (Signed)
Pt called checking on her lab results She has a meeting this afternoon starting 1:30 Best number [phone]

## 2016-08-23 NOTE — Telephone Encounter (Signed)
Spoke to pt. See Labs

## 2016-08-26 ENCOUNTER — Encounter: Payer: Self-pay | Admitting: Internal Medicine

## 2016-08-28 ENCOUNTER — Encounter: Payer: Self-pay | Admitting: Internal Medicine

## 2016-08-28 NOTE — Telephone Encounter (Signed)
Letters placed in front office for pick up pt is aware via mychart

## 2017-01-28 ENCOUNTER — Other Ambulatory Visit: Payer: Self-pay | Admitting: Internal Medicine

## 2017-03-04 ENCOUNTER — Ambulatory Visit
Admission: RE | Admit: 2017-03-04 | Discharge: 2017-03-04 | Disposition: A | Discharge status: Home / Self Care | Payer: BLUE CROSS/BLUE SHIELD | Source: Ambulatory Visit | Attending: Family Medicine | Admitting: Family Medicine

## 2017-03-04 ENCOUNTER — Other Ambulatory Visit: Payer: Self-pay | Admitting: Family Medicine

## 2017-03-04 DIAGNOSIS — R52 Pain, unspecified: Secondary | ICD-10-CM

## 2017-03-04 DIAGNOSIS — M79672 Pain in left foot: Secondary | ICD-10-CM | POA: Diagnosis not present

## 2017-03-10 ENCOUNTER — Encounter: Payer: Self-pay | Admitting: Physician Assistant

## 2017-03-10 ENCOUNTER — Ambulatory Visit (INDEPENDENT_AMBULATORY_CARE_PROVIDER_SITE_OTHER): Payer: BLUE CROSS/BLUE SHIELD | Admitting: Internal Medicine

## 2017-03-10 ENCOUNTER — Encounter: Payer: Self-pay | Admitting: Internal Medicine

## 2017-03-10 VITALS — BP 94/62 | HR 75 | Temp 98.4°F | Wt 125.0 lb

## 2017-03-10 DIAGNOSIS — Z91018 Allergy to other foods: Secondary | ICD-10-CM | POA: Diagnosis not present

## 2017-03-10 DIAGNOSIS — R195 Other fecal abnormalities: Secondary | ICD-10-CM

## 2017-03-10 DIAGNOSIS — R1084 Generalized abdominal pain: Secondary | ICD-10-CM | POA: Diagnosis not present

## 2017-03-10 MED ORDER — ESCITALOPRAM OXALATE 20 MG PO TABS: 20.0000 mg | ORAL_TABLET | Freq: Every day | ORAL | 1 refills | Status: DC

## 2017-03-10 NOTE — Progress Notes (Signed)
Subjective:    Patient ID: Kristi Gray, female    DOB: 02-Aug-1996, 21 y.o.   MRN: [id number]  HPI  Pt presents to the clinic today with c/o abdominal cramping and loose stools. She reports this started 3-4 days ago. She reports this occurs after every time she eats. She denies nausea or vomiting. The cramping is generalized and is relieved by a bowel movement. She denies blood in her stool. She denies urinary or vaginal complaints. She denies changes in her diet or medications. She denies recent travel. She reports she felt just like this 3 years ago when she developed a gluten allergy. She has noticed over the last few days that dairy seems to make it worse and is concerned that she may be developing a lactose allergy. She has taken Lactaid with some relief.   Review of Systems      Past Medical History:  Diagnosis Date  . Depression   . Frequent headaches     Current Outpatient Prescriptions  Medication Sig Dispense Refill  . escitalopram (LEXAPRO) 20 MG tablet Take 1 tablet (20 mg total) by mouth daily. MUST SCHEDULE ANNUAL EXAM 30 tablet 1   No current facility-administered medications for this visit.     Allergies  Allergen Reactions  . Gluten Meal Other (See Comments)    "collapse"  . Lactose Intolerance (Gi) Other (See Comments)    Abdominal pain    Family History  Problem Relation Age of Onset  . Cancer Maternal Grandmother     lung and breast  . Diabetes Paternal Grandfather   . Stroke Neg Hx     Social History   Social History  . Marital status: Single    Spouse name: N/A  . Number of children: N/A  . Years of education: N/A   Occupational History  . Not on file.   Social History Main Topics  . Smoking status: Never Smoker  . Smokeless tobacco: Never Used  . Alcohol use 0.0 oz/week     Comment: occasional  . Drug use: No  . Sexual activity: No   Other Topics Concern  . Not on file   Social History Narrative  . No narrative on file      Constitutional: Denies fever, malaise, fatigue, headache or abrupt weight changes.  Gastrointestinal: Pt reports abdominal cramping and loose stools. Denies bloating, constipation, or blood in the stool.  GU: Denies urgency, frequency, pain with urination, burning sensation, blood in urine, odor or discharge.   No other specific complaints in a complete review of systems (except as listed in HPI above).  Objective:   Physical Exam   BP 94/62   Pulse 75   Temp 98.4 F (36.9 C) (Oral)   Wt 125 lb (56.7 kg)   LMP 03/04/2017 (Exact Date)   SpO2 98%   BMI 19.72 kg/m  Wt Readings from Last 3 Encounters:  03/10/17 125 lb (56.7 kg)  08/05/16 119 lb 4 oz (54.1 kg)  08/02/16 118 lb 12.8 oz (53.9 kg)    General: Appears her stated age, well developed, well nourished in NAD. Cardiovascular: Normal rate and rhythm.  Pulmonary/Chest: Normal effort and positive vesicular breath sounds. No respiratory distress. No wheezes, rales or ronchi noted.  Abdomen: Soft and generally tender. Hyperactive bowel sounds. No distention or masses noted.   BMET    Component Value Date/Time   NA 137 08/02/2016 1608   NA 142 12/23/2014 1601   K 4.1 08/02/2016 1608   K  4.1 12/23/2014 1601   CL 104 08/02/2016 1608   CL 104 12/23/2014 1601   CO2 25 08/02/2016 1608   CO2 32 12/23/2014 1601   GLUCOSE 76 08/02/2016 1608   GLUCOSE 82 12/23/2014 1601   BUN 9 08/02/2016 1608   BUN 8 12/23/2014 1601   CREATININE 0.81 08/02/2016 1608   CALCIUM 9.6 08/02/2016 1608   CALCIUM 9.7 12/23/2014 1601   GFRNONAA >60 12/23/2014 1601   GFRAA >60 12/23/2014 1601    Lipid Panel  No results found for: CHOL, TRIG, HDL, CHOLHDL, VLDL, LDLCALC  CBC    Component Value Date/Time   WBC 6.2 08/02/2016 1608   RBC 4.72 08/02/2016 1608   HGB 13.9 08/02/2016 1608   HGB 13.5 12/23/2014 1601   HCT 41.7 08/02/2016 1608   HCT 39.7 12/23/2014 1601   PLT 279 08/02/2016 1608   PLT 293 12/23/2014 1601   MCV 88.3  08/02/2016 1608   MCV 88 12/23/2014 1601   MCH 29.4 08/02/2016 1608   MCHC 33.3 08/02/2016 1608   RDW 12.8 08/02/2016 1608   RDW 13.2 12/23/2014 1601   LYMPHSABS 2,294 08/02/2016 1608   LYMPHSABS 2.5 12/23/2014 1601   MONOABS 434 08/02/2016 1608   MONOABS 0.7 12/23/2014 1601   EOSABS 124 08/02/2016 1608   EOSABS 0.1 12/23/2014 1601   BASOSABS 62 08/02/2016 1608   BASOSABS 0.1 12/23/2014 1601    Hgb A1C No results found for: HGBA1C         Assessment & Plan:   Generalized Abdominal Pain and Loose Stools:  Does not sound viral She does not have an acute abdomen, has had appendectomy Advised her to keep a food diary Avoid dairy for now Referral placed to GI for further evaluation  RTC as needed or if symptom persist or worsen BAITY, REGINA, NP

## 2017-03-10 NOTE — Patient Instructions (Signed)

## 2017-03-17 ENCOUNTER — Encounter: Payer: Self-pay | Admitting: *Deleted

## 2017-03-18 ENCOUNTER — Other Ambulatory Visit (INDEPENDENT_AMBULATORY_CARE_PROVIDER_SITE_OTHER): Payer: BLUE CROSS/BLUE SHIELD

## 2017-03-18 ENCOUNTER — Other Ambulatory Visit: Payer: Self-pay | Admitting: Physician Assistant

## 2017-03-18 ENCOUNTER — Encounter: Payer: Self-pay | Admitting: Physician Assistant

## 2017-03-18 ENCOUNTER — Ambulatory Visit (INDEPENDENT_AMBULATORY_CARE_PROVIDER_SITE_OTHER): Payer: BLUE CROSS/BLUE SHIELD | Admitting: Physician Assistant

## 2017-03-18 VITALS — BP 116/62 | HR 64 | Ht 67.0 in | Wt 122.0 lb

## 2017-03-18 DIAGNOSIS — R197 Diarrhea, unspecified: Secondary | ICD-10-CM

## 2017-03-18 DIAGNOSIS — K9041 Non-celiac gluten sensitivity: Secondary | ICD-10-CM

## 2017-03-18 DIAGNOSIS — R1084 Generalized abdominal pain: Secondary | ICD-10-CM

## 2017-03-18 LAB — CBC WITH DIFFERENTIAL/PLATELET
BASOS PCT: 0.9 % (ref 0.0–3.0)
Basophils Absolute: 0.1 10*3/uL (ref 0.0–0.1)
EOS ABS: 0.1 10*3/uL (ref 0.0–0.7)
Eosinophils Relative: 1.8 % (ref 0.0–5.0)
HCT: 39.9 % (ref 36.0–46.0)
Hemoglobin: 13.6 g/dL (ref 12.0–15.0)
Lymphocytes Relative: 27.3 % (ref 12.0–46.0)
Lymphs Abs: 1.6 10*3/uL (ref 0.7–4.0)
MCHC: 34.1 g/dL (ref 30.0–36.0)
MCV: 88.8 fl (ref 78.0–100.0)
MONO ABS: 0.4 10*3/uL (ref 0.1–1.0)
Monocytes Relative: 7.5 % (ref 3.0–12.0)
NEUTROS ABS: 3.7 10*3/uL (ref 1.4–7.7)
Neutrophils Relative %: 62.5 % (ref 43.0–77.0)
Platelets: 273 10*3/uL (ref 150.0–400.0)
RBC: 4.49 Mil/uL (ref 3.87–5.11)
RDW: 13.5 % (ref 11.5–15.5)
WBC: 5.8 10*3/uL (ref 4.0–10.5)

## 2017-03-18 LAB — HIGH SENSITIVITY CRP: CRP HIGH SENSITIVITY: 0.15 mg/L (ref 0.000–5.000)

## 2017-03-18 LAB — SEDIMENTATION RATE: SED RATE: 5 mm/h (ref 0–20)

## 2017-03-18 MED ORDER — HYOSCYAMINE SULFATE SL 0.125 MG SL SUBL: 1.0000 | SUBLINGUAL_TABLET | Freq: Four times a day (QID) | SUBLINGUAL | 2 refills | Status: AC

## 2017-03-18 NOTE — Progress Notes (Signed)
Subjective:    Patient ID: Kristi Gray, female    DOB: 07-Feb-1996, 21 y.o.   MRN: [id number]  HPI Quintara is a pleasant 21 year old white female, new to GI today referred by Nicki Reaper NP. Patient will be graduating from [employer] later this month and relocating back to [place] which is her home. She relates a long history of GI symptoms with abdominal cramping and loose stools She says she has had a significant gluten intolerance at least over the past 3 years and has completely avoided gluten. She says she makes most of her own food and knows that if she consumes even a little bit of gluten she usually will develop a migraine-type headache nausea and diarrhea. She has no family history of celiac disease and 9 has not had formal testing. She says more recently she has also become more lactose intolerant. She has tried to avoid ice cream and milk but previously had been able to tolerate cheese etc. She says recently she's had some episodes of diarrhea and abdominal cramping despite taking Lactaid  on a when necessary basis. She's suspended as despite being very careful with her diet she has some "flares" of GI symptoms with diarrhea abdominal cramping gas bloating and nausea unrelated to any known dietary indiscretion. She will frequently have postprandial bowel movements which will often be loose. She has not noticed any bleeding. She says her abdomen is always tender. Her energy level has been normal for her and her weight has been stable. She does not feel she's been on any under any significant increased stress.  She did undergo some rheumatology evaluation last fall because of a positive ANA at 1-80. She says Lupus was ruled out and she was not given any other diagnoses.  Review of Systems Pertinent positive and negative review of systems were noted in the above HPI section.  All other review of systems was otherwise negative.  Outpatient Encounter Prescriptions as of 03/18/2017    Medication Sig  . escitalopram (LEXAPRO) 20 MG tablet Take 1 tablet (20 mg total) by mouth daily. MUST SCHEDULE ANNUAL EXAM  . Multiple Vitamin (MULTIVITAMIN) tablet Take 1 tablet by mouth daily.  . Probiotic Product (PROBIOTIC DAILY PO) Take 1 capsule by mouth daily.  Marland Kitchen Hyoscyamine Sulfate SL (LEVSIN/SL) 0.125 MG SUBL Place 1 tablet under the tongue every 6 (six) hours.   No facility-administered encounter medications on file as of 03/18/2017.    Allergies  Allergen Reactions  . Gluten Meal Other (See Comments)    "collapse"  . Lactose Intolerance (Gi) Other (See Comments)    Abdominal pain   Patient Active Problem List   Diagnosis Date Noted  . Acne 02/28/2015  . Anxiety and depression 10/18/2014   Social History   Social History  . Marital status: Single    Spouse name: N/A  . Number of children: N/A  . Years of education: N/A   Occupational History  . Not on file.   Social History Main Topics  . Smoking status: Never Smoker  . Smokeless tobacco: Never Used  . Alcohol use 0.0 oz/week     Comment: occasional  . Drug use: No  . Sexual activity: No   Other Topics Concern  . Not on file   Social History Narrative  . No narrative on file    Ms. Ekman's family history includes Breast cancer in her maternal grandmother; Diabetes in her paternal grandfather; Lung cancer in her maternal grandmother.  Objective:    Vitals:   03/18/17 0931  BP: 116/62  Pulse: 64    Physical Exam well-developed thin young white female in no acute distress, pleasant blood pressure 116/62 pulse 64, height 5 foot 7, weight 122, BMI of 19.1. HEENT; nontraumatic normocephalic EOMI PERRLA sclera anicteric, Cardiovascular ;regular rate and rhythm with S1-S2 no murmur or gallop, Pulmonary ;clear bilaterally, Abdomen; soft, she has rather diffuse generalized tenderness is no palpable mass or hepatosplenomegaly bowel sounds are present, Rectal; exam not done, Extremities; no clubbing  cyanosis or edema skin warm and dry, Neuropsych; mood and affect appropriate       Assessment & Plan:   #80 21 year old white female, college student with at least a three-year history of gluten intolerance with development of migraine headaches nausea vomiting and diarrhea with gluten intake which she has been fairly strictly  avoiding. #2 lactose intolerance #3 positive ANA 1-80  I suspect she does have celiac disease, she should have formal workup. Rule out underlying IBD vs IBS  Plan; CBC with differential, sedimentation rate, CRP, celiac panel Start trial of Levsin 1 by mouth every 4-6 hours when necessary for cramping and diarrhea Suggest that she started daily probiotic-i.e. align or other gluten free once daily She will need EGD with small bowel biopsies. She says she is moving back to [facility] within the next few weeks, and therefore advise she get established with a gastroenterologist there to proceed with further GI workup.  Page Pucciarelli Oswald Hillock PA-C 03/18/2017   Cc: Lorre Munroe, NP

## 2017-03-18 NOTE — Patient Instructions (Addendum)
Please go to the basement level to have your labs drawn.  We sent a prescription to [facility], [place]. 1. Levsin 0.125 mg SL.

## 2017-03-19 ENCOUNTER — Other Ambulatory Visit: Payer: BLUE CROSS/BLUE SHIELD

## 2017-03-19 NOTE — Progress Notes (Signed)
I agree with the above note, plan 

## 2017-03-22 LAB — CELIAC PNL 2 RFLX ENDOMYSIAL AB TTR
(TTG) AB, IGG: 2 U/mL
Endomysial Ab IgA: NEGATIVE
GLIADIN(DEAM) AB,IGA: 9 U (ref <20)
Gliadin(Deam) Ab,IgG: 4 U (ref <20)
IMMUNOGLOBULIN A: 234 mg/dL (ref 81–463)

## 2017-06-02 IMAGING — CT CT ABD-PELV W/ CM
1 of 2 series · 15 of 32 positions shown, 19 images · IV contrast (APPLIED)
Comparison: None

CLINICAL DATA: LUQ pain going into chest, extreme nausea, feeling
like there little needles sticking into her entire abdomen and
chest, symptoms for 2 years more pronounced in last 1.5 weeks

EXAM:
CT ABDOMEN AND PELVIS WITH CONTRAST
TECHNIQUE: Multidetector CT imaging of the abdomen and pelvis was performed
using the standard protocol following bolus administration of
intravenous contrast. Sagittal and coronal MPR images reconstructed
from axial data set.
CONTRAST:  75mL [id number] IOPAMIDOL (KGPMP9-0VV) INJECTION 61% IV.
Dilute oral contrast.

[Series 2: axial st · axial · 0.58mm/px · z∈[-974,-589]mm · 15 of 85 slices shown, 19 images]
[im 4/85  soft-tissue]
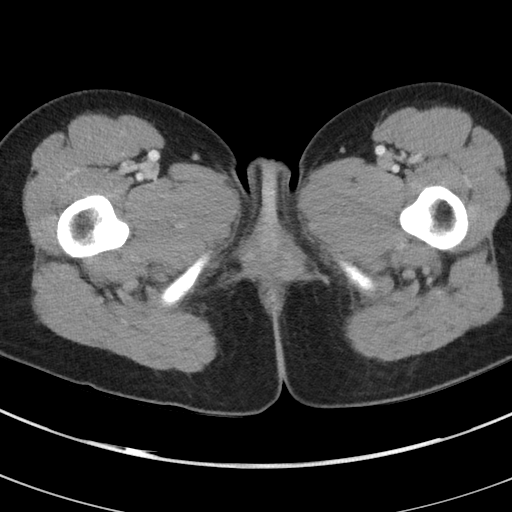
[im 4/85  bone]
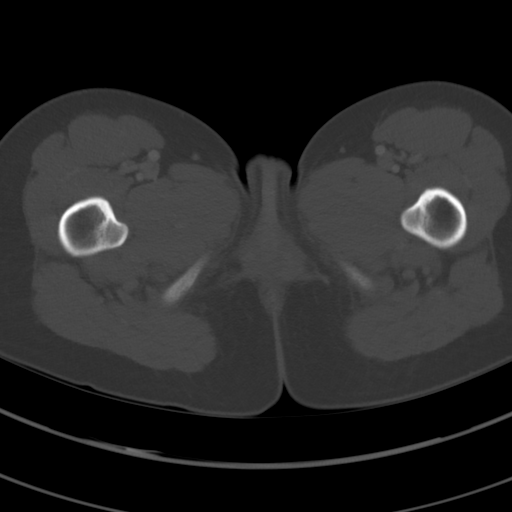
[im 11/85  soft-tissue]
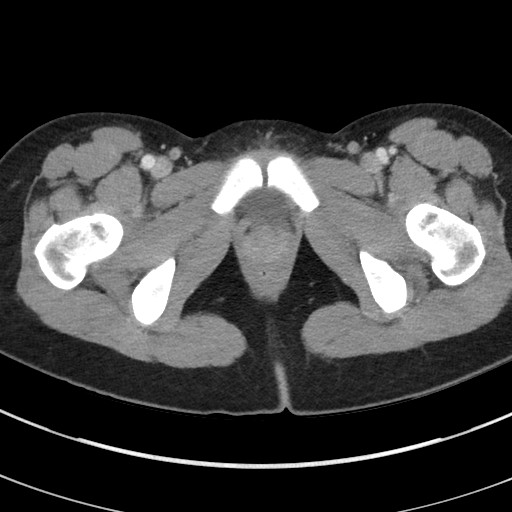
[im 18/85  soft-tissue]
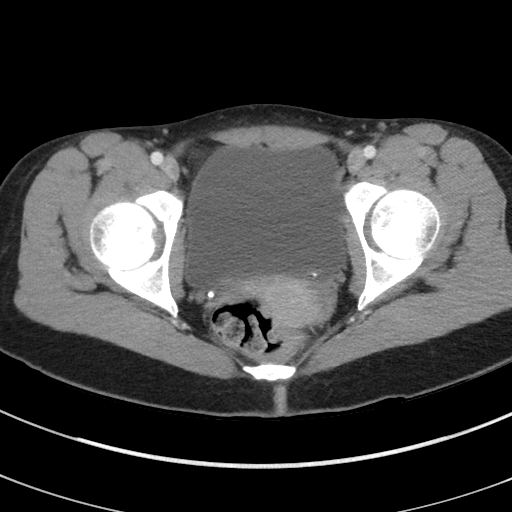
[im 25/85  soft-tissue]
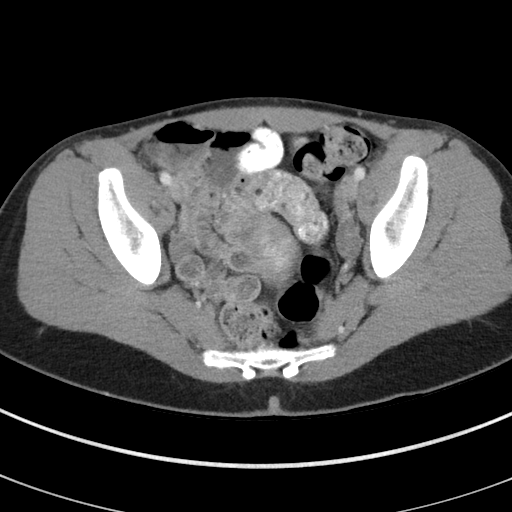
[im 29/85  soft-tissue]
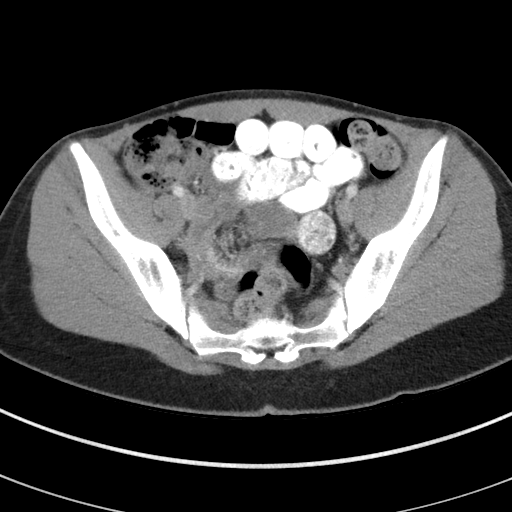
[im 36/85  soft-tissue]
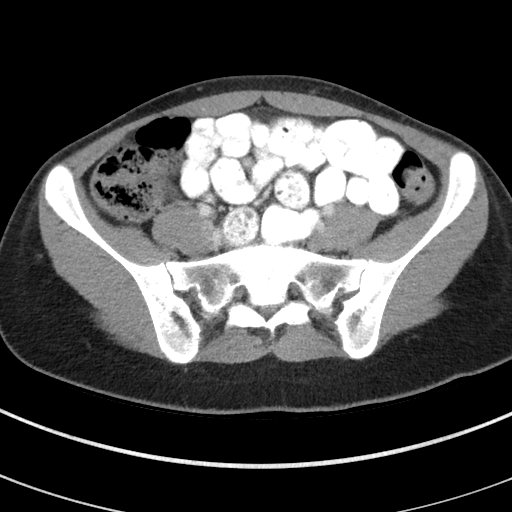
[im 43/85  soft-tissue]
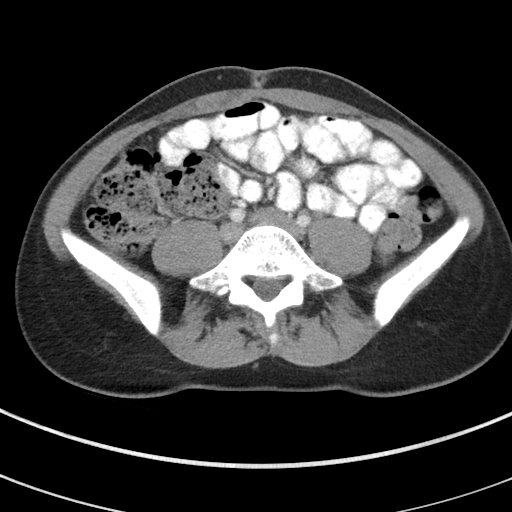
[im 50/85  soft-tissue]
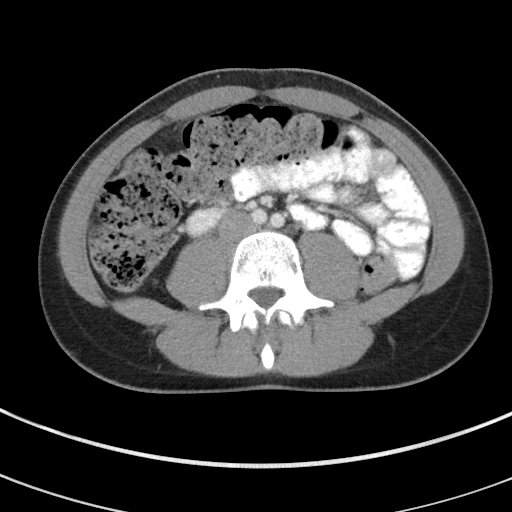
[im 57/85  soft-tissue]
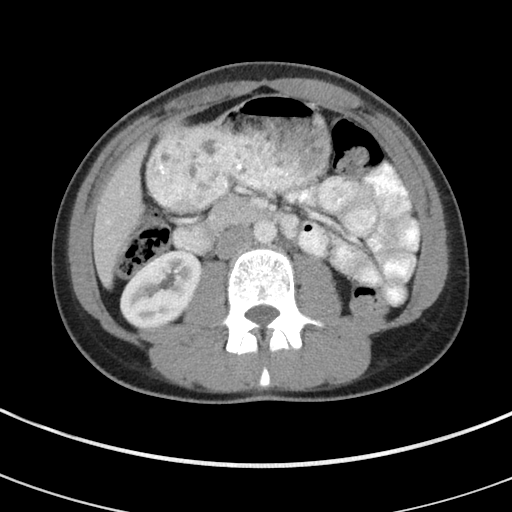
[im 57/85  bone]
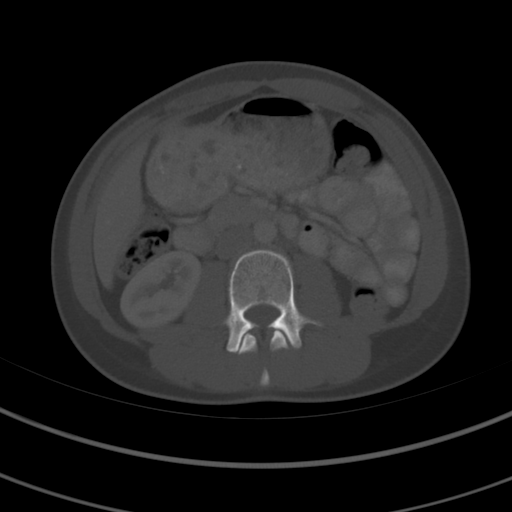
[im 60/85  soft-tissue]
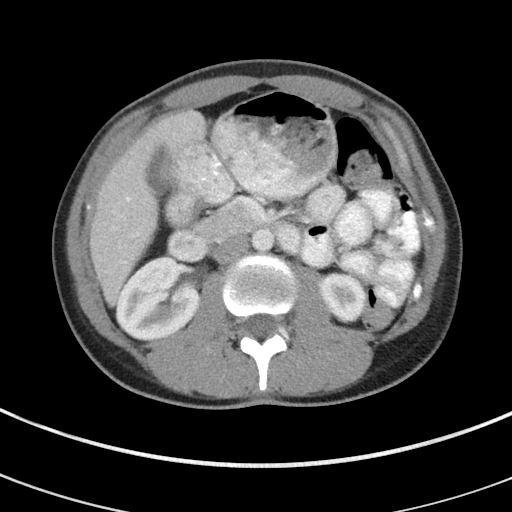
[im 67/85  soft-tissue]
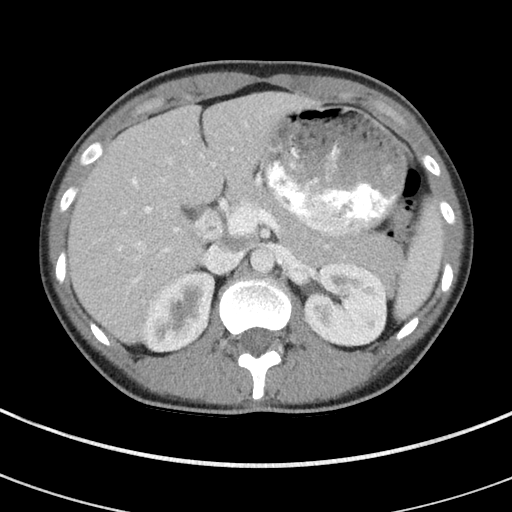
[im 71/85  lung]
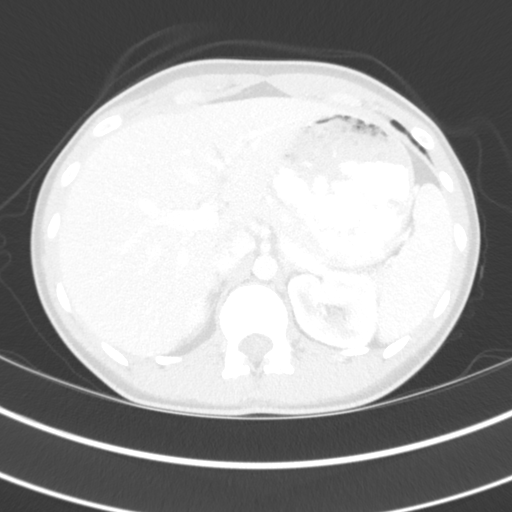
[im 74/85  soft-tissue]
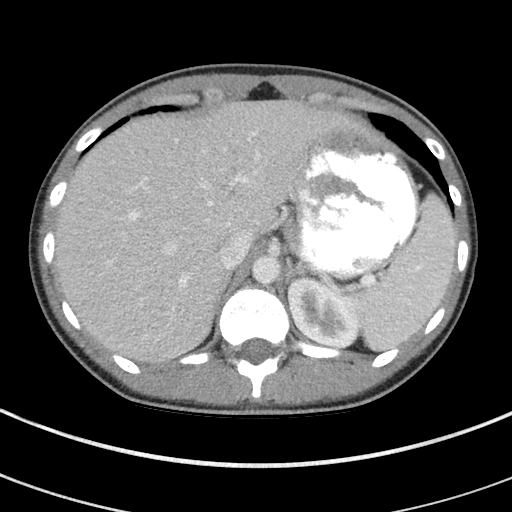
[im 74/85  lung]
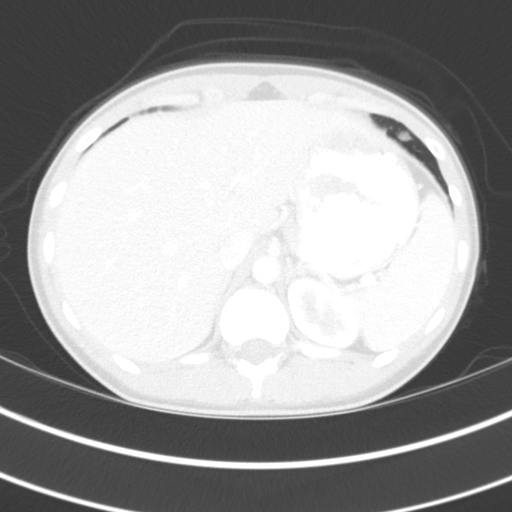
[im 78/85  lung]
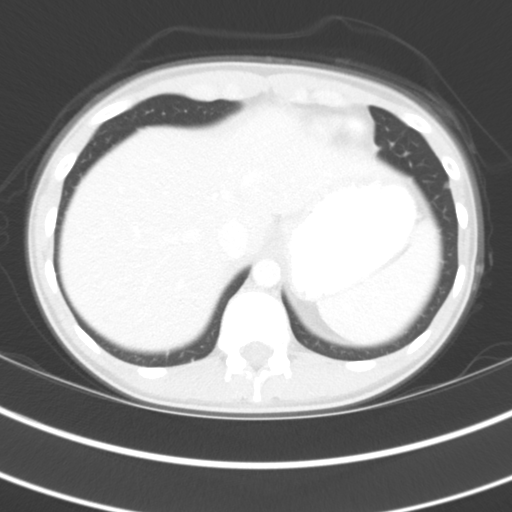
[im 81/85  soft-tissue]
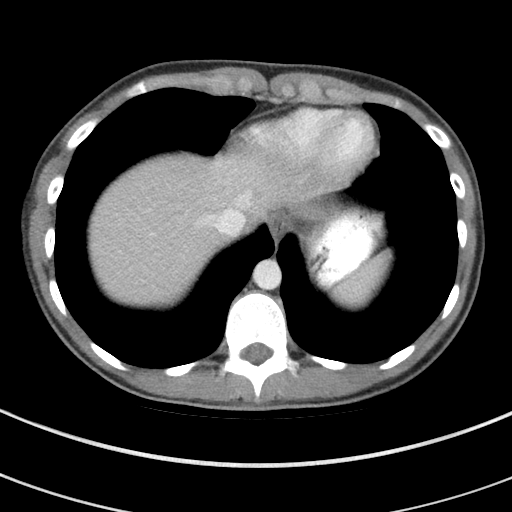
[im 81/85  lung]
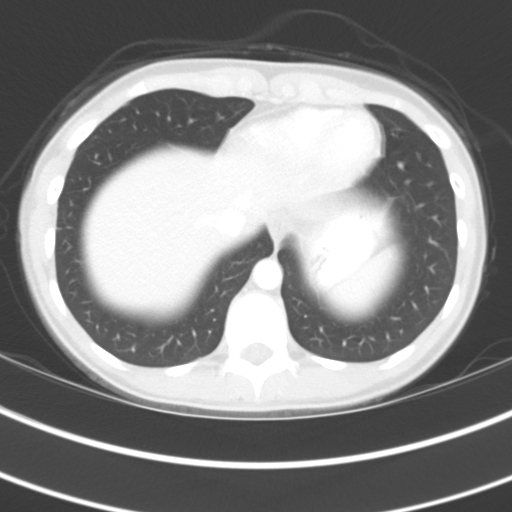

[15 of 32 positions shown; findings below may reference images not displayed]

FINDINGS: Lower chest: Multiple small LEFT lung base nodules up to 7 mm
diameter image 2, with the largest nodules demonstrating
calcification compatible with pulmonary granulomata.

Hepatobiliary: Contracted gallbladder.  Liver normal appearance.

Pancreas: Normal appearance

Spleen: Normal appearance

Adrenals/Urinary Tract: Adrenal glands, kidneys, ureters, and
bladder normal appearance.

Stomach/Bowel: Appendix surgically absent by history. Increased
stool in proximal half of of colon. Food debris and contrast
distends stomach. Stomach and bowel loops otherwise normal
appearance.

Vascular/Lymphatic: Aorta normal caliber. No adenopathy. Few tiny
pelvic phleboliths.

Reproductive: Unremarkable uterus and adnexa

Other: No free air or free fluid. No hernia or inflammatory process.

Musculoskeletal: Normal appearance
IMPRESSION: Increased stool in proximal half of colon.

No additional intra-abdominal or intrapelvic abnormalities.

Multiple LEFT lung base pulmonary nodules, the largest of which are
calcified compatible with pulmonary granulomata.

## 2017-06-03 ENCOUNTER — Other Ambulatory Visit: Payer: Self-pay | Admitting: Internal Medicine

## 2017-12-26 IMAGING — CR DG FOOT COMPLETE 3+V*L*
1 series · 3 of 3 positions shown · non-contrast
Comparison: None.

CLINICAL DATA: Medial left foot pain beginning today. No known
injury.

EXAM:
LEFT FOOT - COMPLETE 3+ VIEW

[Series 1: dg foot complete left · 0.14mm/px · 3 of 3 slices shown]
[im 1/3]
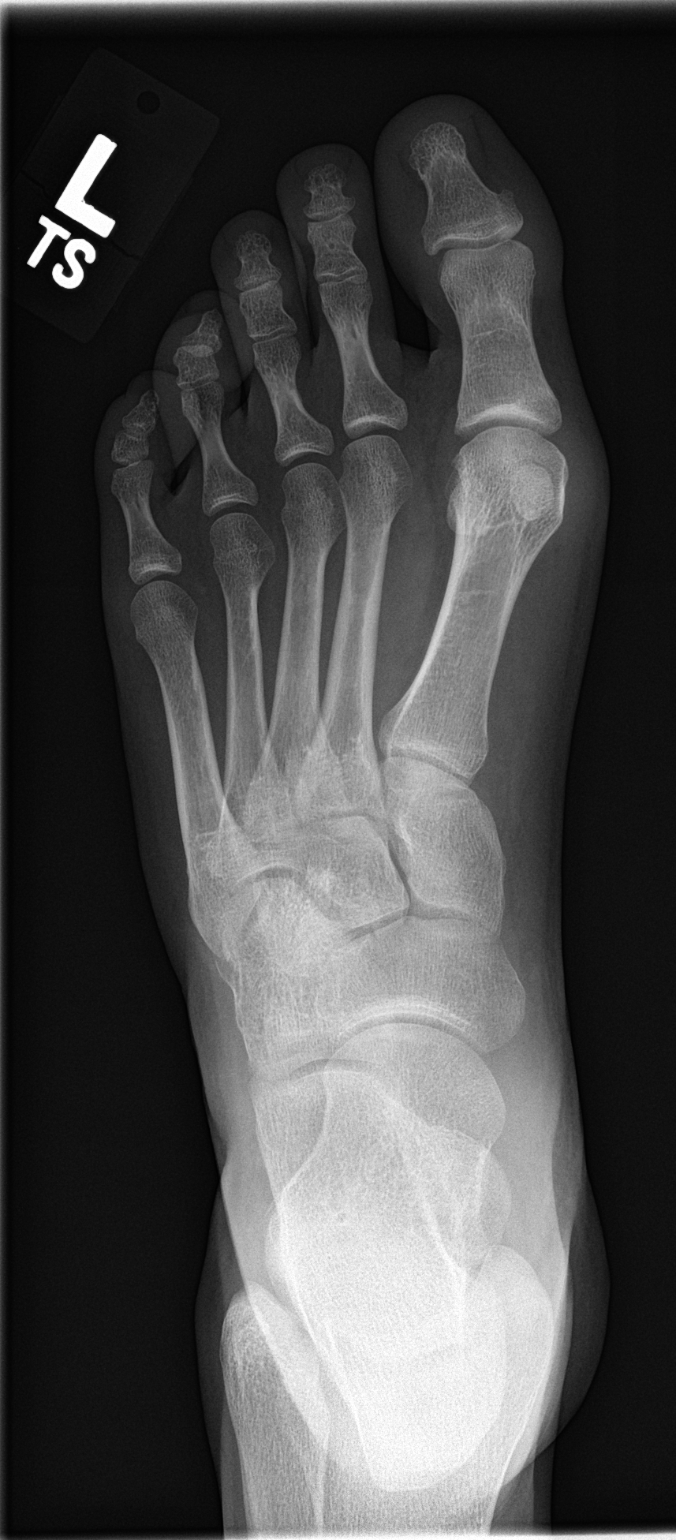
[im 2/3]
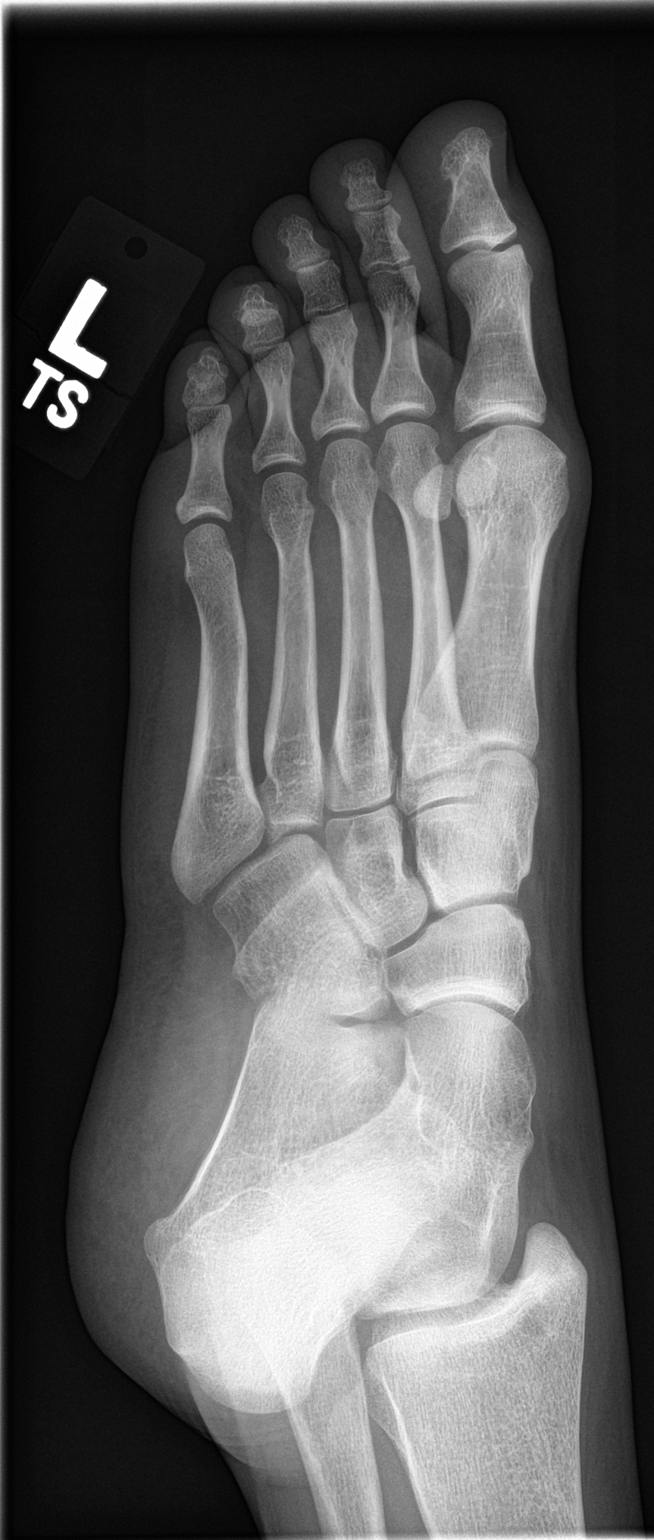
[im 3/3]
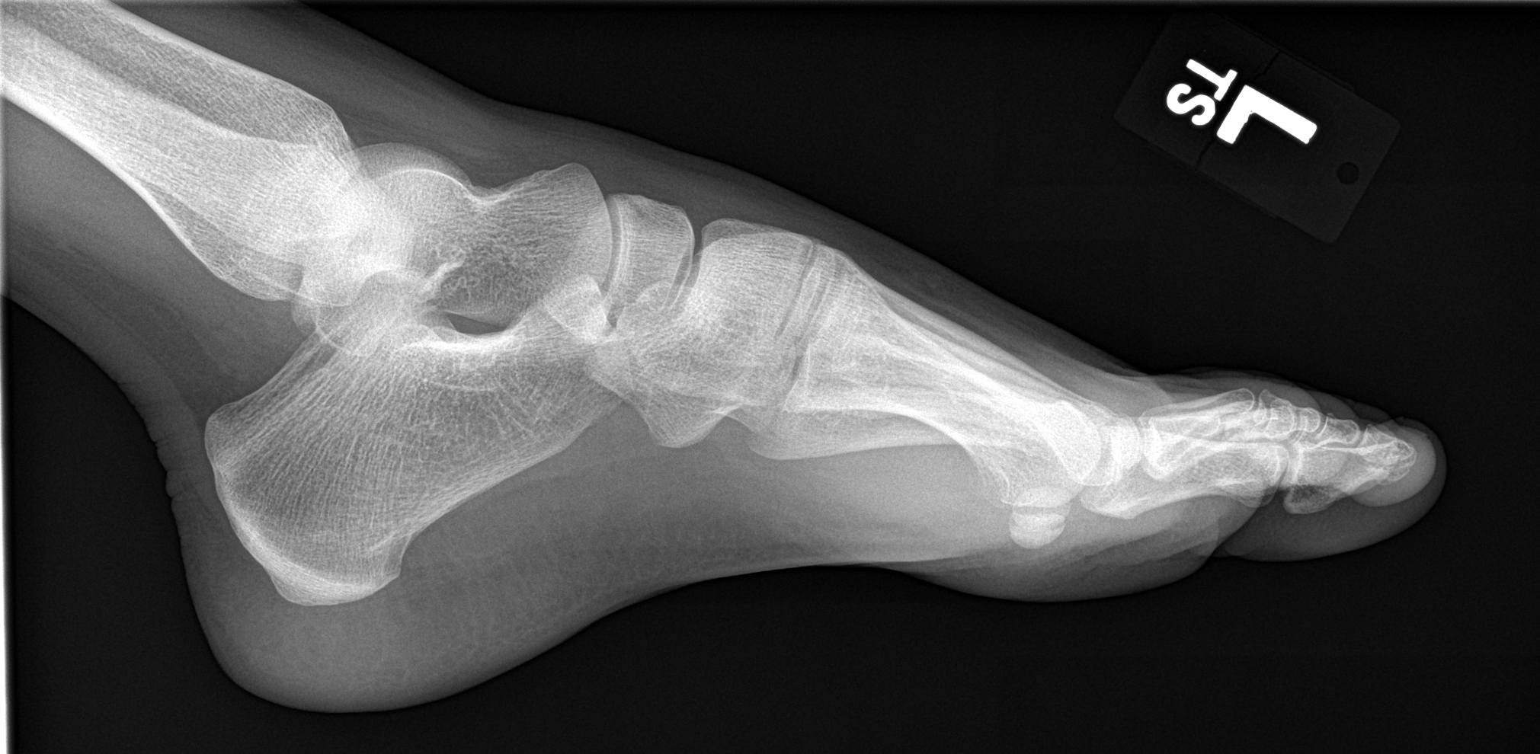

[3 of 3 positions shown; findings below may reference images not displayed]

FINDINGS: There is no evidence of fracture or dislocation. There is no
evidence of arthropathy or other focal bone abnormality. Soft
tissues are unremarkable.
IMPRESSION: Normal exam.
